# Patient Record
Sex: Male | Born: 1945 | Race: White | Hispanic: No | Marital: Married
Health system: Southern US, Community
[De-identification: ages and names within clinical notes are randomized; demographics above are authoritative.]

---

## 2004-02-05 ENCOUNTER — Ambulatory Visit (HOSPITAL_COMMUNITY): Admission: RE | Admit: 2004-02-05 | Discharge: 2004-02-05 | Payer: Self-pay | Admitting: Ophthalmology

## 2005-12-24 IMAGING — CR DG CHEST 2V
2 series · 2 of 2 positions shown · non-contrast
Comparison: none

CLINICAL DATA: Preoperative respiratory evaluation.  Patient with retinal detachment. 
 CHEST (TWO VIEWS)
 PA and lateral views of the chest dated 02/05/2004 are reviewed without prior films for comparison.  The heart and pulmonary vascularity are thought to be within normal limits.  The lung fields are clear.  There is some eventration of the left hemidiaphragm. 
 IMPRESSION
 Negative chest for active disease with discussion as above.

[view not recorded (1 of 2)]
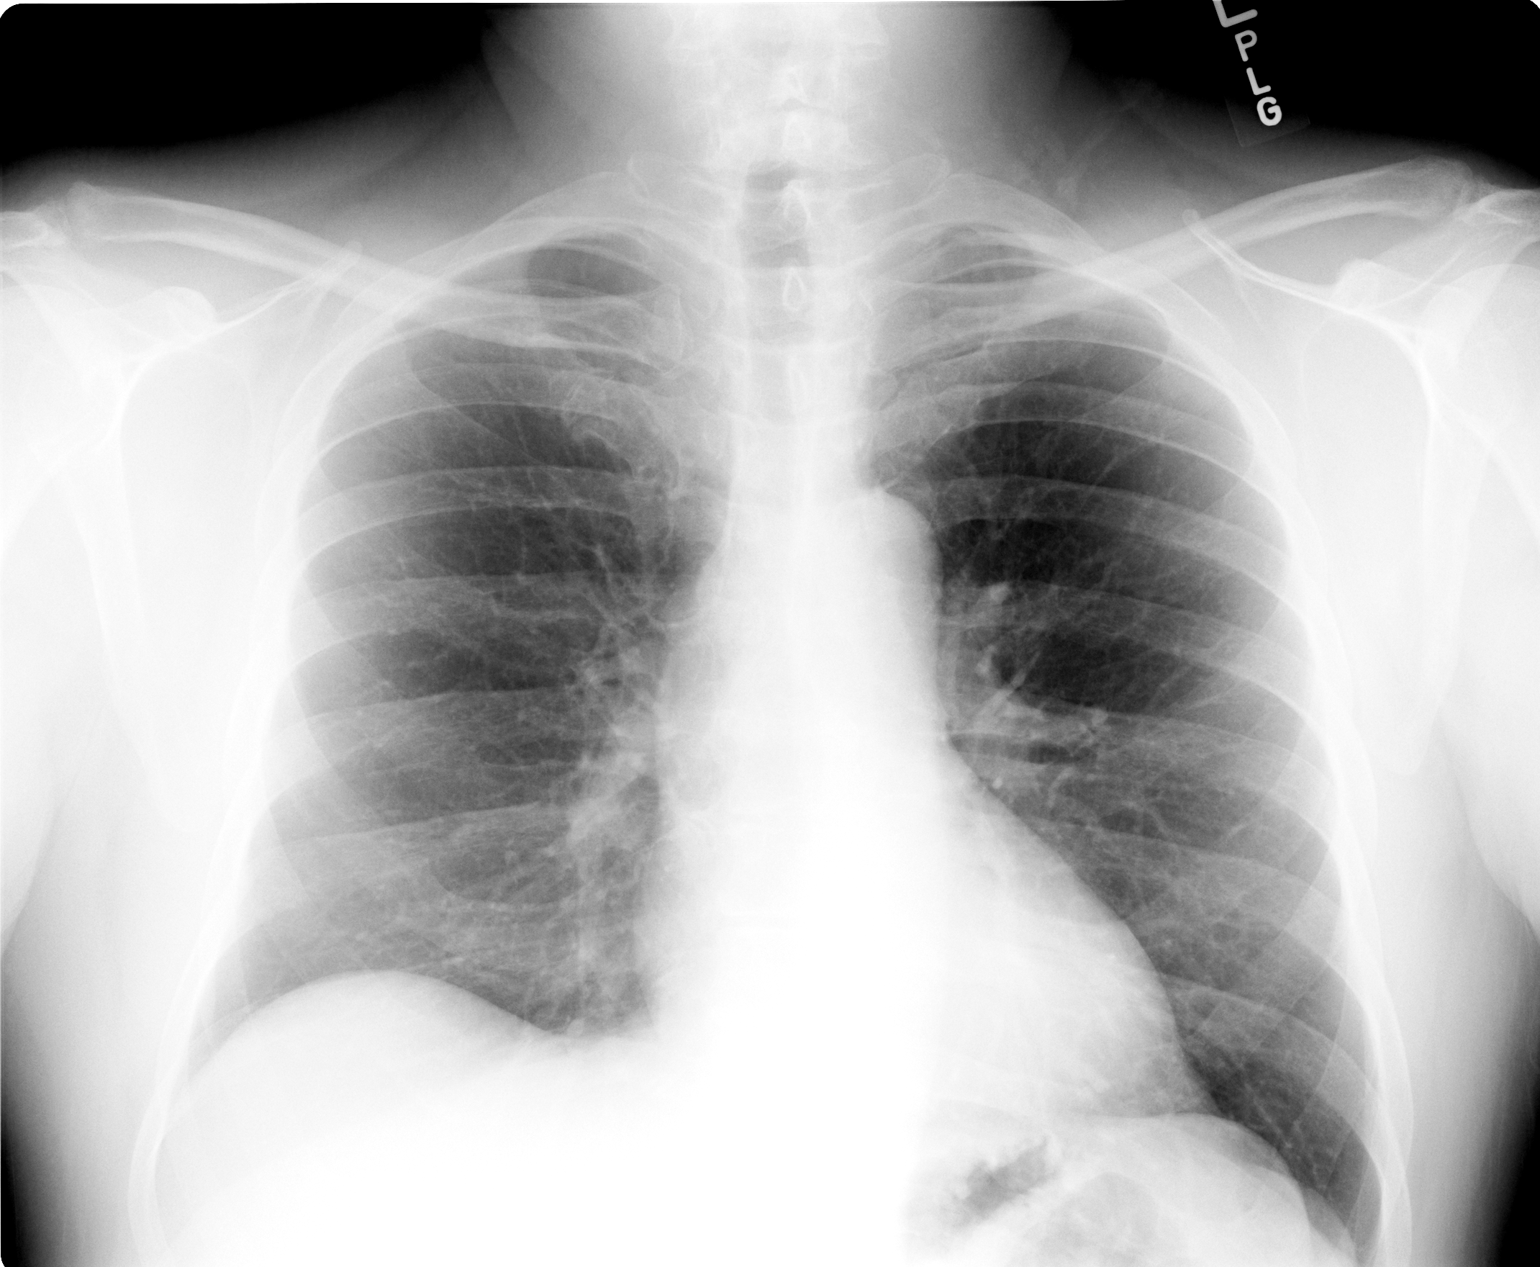

[view not recorded (2 of 2)]
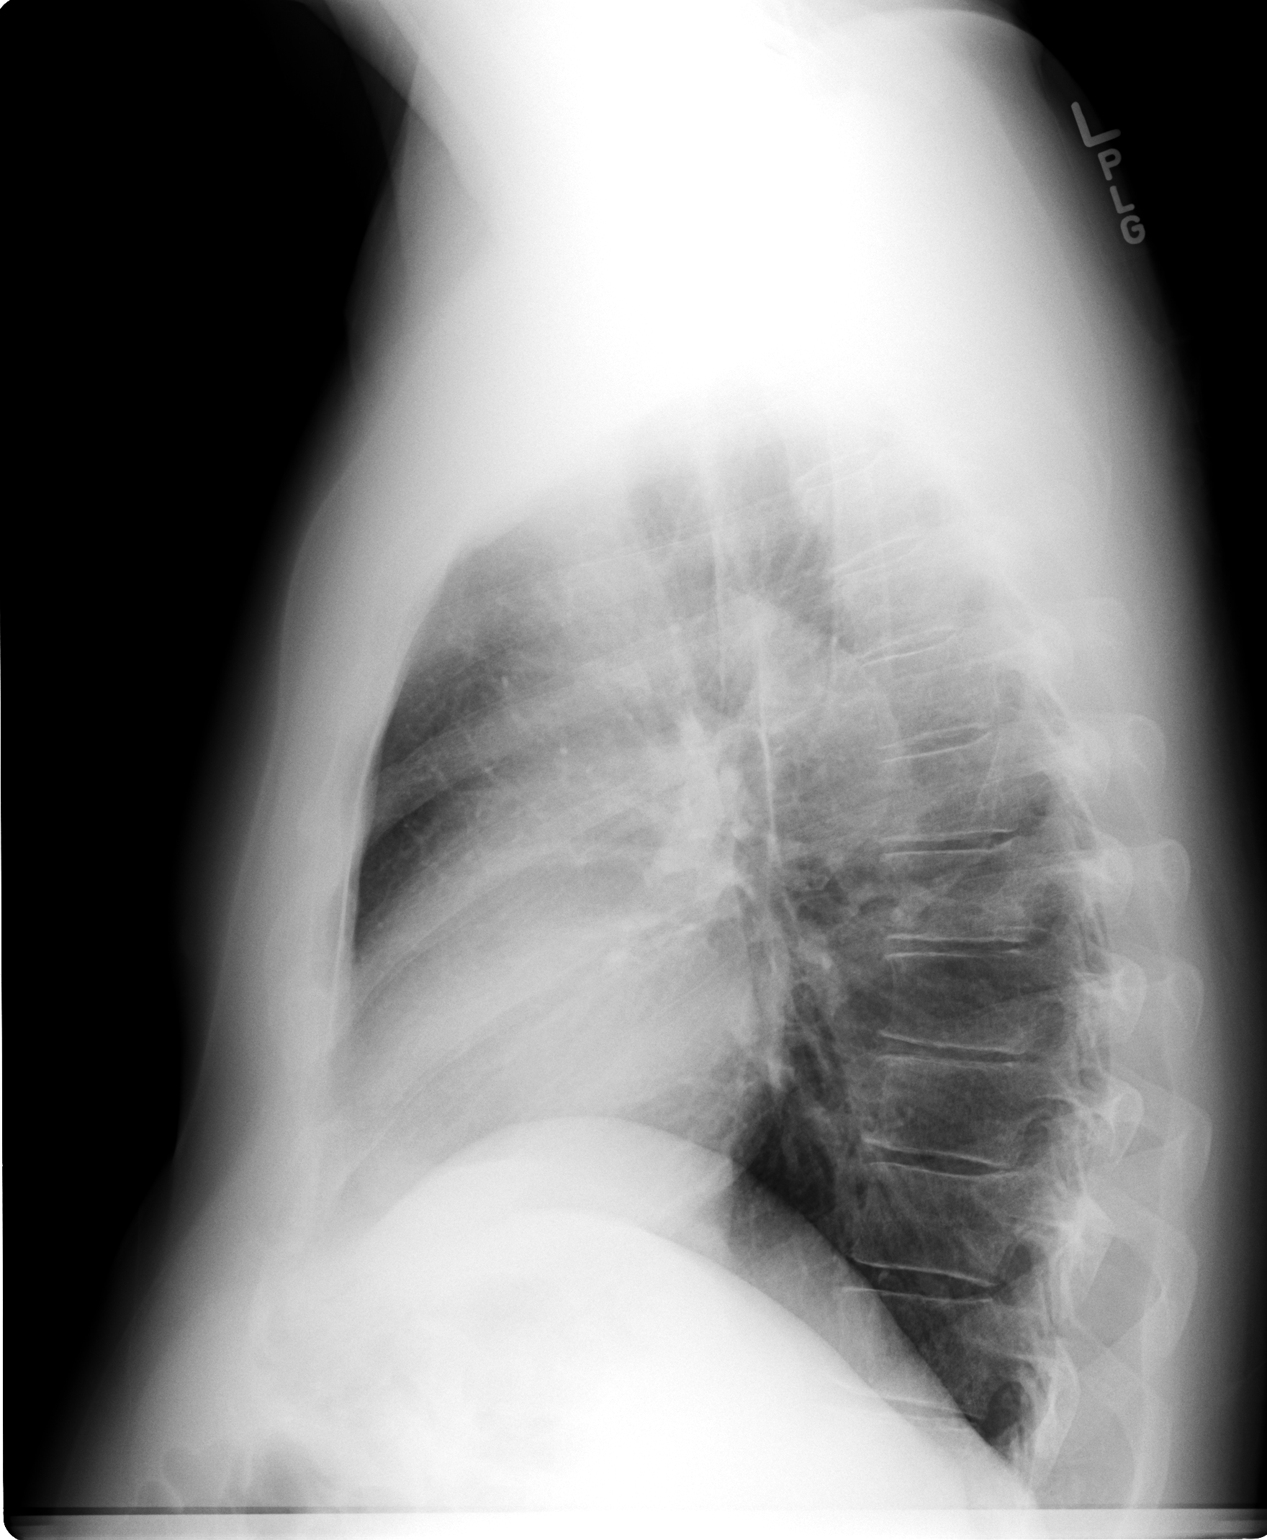

[2 of 2 positions shown; findings below may reference images not displayed]

## 2019-10-28 ENCOUNTER — Ambulatory Visit: Payer: Medicare HMO | Attending: Internal Medicine

## 2019-10-28 DIAGNOSIS — Z23 Encounter for immunization: Secondary | ICD-10-CM | POA: Insufficient documentation

## 2019-10-28 NOTE — Progress Notes (Signed)
   Covid-19 Vaccination Clinic  Name:  Ryan Wong    MRN: [id number] DOB: 12-Feb-1946  10/28/2019  Ryan Wong was observed post Covid-19 immunization for 15 minutes without incidence. He was provided with Vaccine Information Sheet and instruction to access the V-Safe system.   Ryan Wong was instructed to call 911 with any severe reactions post vaccine: Marland Kitchen Difficulty breathing  . Swelling of your face and throat  . A fast heartbeat  . A bad rash all over your body  . Dizziness and weakness    Immunizations Administered    Name Date Dose VIS Date Route   Pfizer COVID-19 Vaccine 10/28/2019 11:56 AM 0.3 mL 09/16/2019 Intramuscular   Manufacturer: [employer], [employer]   Lot: [id number]   NDC: [address]-0

## 2019-11-18 ENCOUNTER — Ambulatory Visit: Payer: Medicare HMO | Attending: Internal Medicine

## 2019-11-18 DIAGNOSIS — Z23 Encounter for immunization: Secondary | ICD-10-CM | POA: Insufficient documentation

## 2019-11-18 NOTE — Progress Notes (Signed)
   Covid-19 Vaccination Clinic  Name:  Ryan Wong    MRN: [id number] DOB: 08/27/1946  11/18/2019  Ryan Wong was observed post Covid-19 immunization for 15 minutes without incidence. He was provided with Vaccine Information Sheet and instruction to access the V-Safe system.   Ryan Wong was instructed to call 911 with any severe reactions post vaccine: Marland Kitchen Difficulty breathing  . Swelling of your face and throat  . A fast heartbeat  . A bad rash all over your body  . Dizziness and weakness    Immunizations Administered    Name Date Dose VIS Date Route   Pfizer COVID-19 Vaccine 11/18/2019  4:41 PM 0.3 mL 09/16/2019 Intramuscular   Manufacturer: [employer], [employer]   Lot: [id number]   NDC: [address]-2
